# Patient Record
Sex: Male | Born: 2000 | Race: White | Hispanic: No | Marital: Single | Smoking: Never smoker
Health system: Southern US, Community
[De-identification: ages and names within clinical notes are randomized; demographics above are authoritative.]

## PROBLEM LIST (undated history)

## (undated) DIAGNOSIS — H902 Conductive hearing loss, unspecified: Secondary | ICD-10-CM

## (undated) DIAGNOSIS — H9325 Central auditory processing disorder: Secondary | ICD-10-CM

## (undated) HISTORY — DX: Central auditory processing disorder: H93.25

## (undated) HISTORY — DX: Conductive hearing loss, unspecified: H90.2

---
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
## 2020-01-04 ENCOUNTER — Ambulatory Visit: Payer: No Typology Code available for payment source | Attending: Internal Medicine

## 2020-01-04 DIAGNOSIS — Z20822 Contact with and (suspected) exposure to covid-19: Secondary | ICD-10-CM

## 2020-01-05 LAB — NOVEL CORONAVIRUS, NAA: SARS-CoV-2, NAA: NOT DETECTED

## 2020-03-03 ENCOUNTER — Ambulatory Visit: Payer: No Typology Code available for payment source | Attending: Internal Medicine

## 2020-03-03 DIAGNOSIS — Z23 Encounter for immunization: Secondary | ICD-10-CM

## 2020-03-03 NOTE — Progress Notes (Signed)
   Covid-19 Vaccination Clinic  Name:  Kevante Lunt    MRN: [id number] DOB: 07-Dec-2000  03/03/2020  Mr. Doyon was observed post Covid-19 immunization for 15 minutes without incident. He was provided with Vaccine Information Sheet and instruction to access the V-Safe system.   Mr. Embleton was instructed to call 911 with any severe reactions post vaccine: Marland Kitchen Difficulty breathing  . Swelling of face and throat  . A fast heartbeat  . A bad rash all over body  . Dizziness and weakness   Immunizations Administered    Name Date Dose VIS Date Route   Pfizer COVID-19 Vaccine 03/03/2020  3:29 PM 0.3 mL 11/11/2019 Intramuscular   Manufacturer: [employer], [employer]   Lot: [id number]   NDC: [address]-1

## 2020-03-27 ENCOUNTER — Ambulatory Visit: Payer: No Typology Code available for payment source | Attending: Internal Medicine

## 2020-03-27 DIAGNOSIS — Z23 Encounter for immunization: Secondary | ICD-10-CM

## 2020-03-27 NOTE — Progress Notes (Signed)
   Covid-19 Vaccination Clinic  Name:  Juan Hess    MRN: [id number] DOB: 2001/09/14  03/27/2020  Mr. Pavao was observed post Covid-19 immunization for 15 minutes without incident. He was provided with Vaccine Information Sheet and instruction to access the V-Safe system.   Mr. Toran was instructed to call 911 with any severe reactions post vaccine: Marland Kitchen Difficulty breathing  . Swelling of face and throat  . A fast heartbeat  . A bad rash all over body  . Dizziness and weakness   Immunizations Administered    Name Date Dose VIS Date Route   Pfizer COVID-19 Vaccine 03/27/2020 10:49 AM 0.3 mL 01/25/2019 Intramuscular   Manufacturer: [employer], [employer]   Lot: [id number]   NDC: [address]-2

## 2022-12-22 DIAGNOSIS — E039 Hypothyroidism, unspecified: Secondary | ICD-10-CM | POA: Diagnosis not present

## 2022-12-22 DIAGNOSIS — Z0000 Encounter for general adult medical examination without abnormal findings: Secondary | ICD-10-CM | POA: Diagnosis not present

## 2022-12-22 DIAGNOSIS — Z23 Encounter for immunization: Secondary | ICD-10-CM | POA: Diagnosis not present

## 2022-12-22 DIAGNOSIS — F900 Attention-deficit hyperactivity disorder, predominantly inattentive type: Secondary | ICD-10-CM | POA: Diagnosis not present

## 2023-01-30 DIAGNOSIS — E039 Hypothyroidism, unspecified: Secondary | ICD-10-CM | POA: Diagnosis not present

## 2023-01-30 DIAGNOSIS — F900 Attention-deficit hyperactivity disorder, predominantly inattentive type: Secondary | ICD-10-CM | POA: Diagnosis not present

## 2023-03-04 DIAGNOSIS — F900 Attention-deficit hyperactivity disorder, predominantly inattentive type: Secondary | ICD-10-CM | POA: Diagnosis not present

## 2023-06-08 DIAGNOSIS — E039 Hypothyroidism, unspecified: Secondary | ICD-10-CM | POA: Diagnosis not present

## 2023-06-08 DIAGNOSIS — F900 Attention-deficit hyperactivity disorder, predominantly inattentive type: Secondary | ICD-10-CM | POA: Diagnosis not present

## 2023-08-20 DIAGNOSIS — H1789 Other corneal scars and opacities: Secondary | ICD-10-CM | POA: Diagnosis not present

## 2023-12-24 DIAGNOSIS — Z1159 Encounter for screening for other viral diseases: Secondary | ICD-10-CM | POA: Diagnosis not present

## 2024-06-17 DIAGNOSIS — R109 Unspecified abdominal pain: Secondary | ICD-10-CM | POA: Diagnosis not present

## 2024-06-17 DIAGNOSIS — R58 Hemorrhage, not elsewhere classified: Secondary | ICD-10-CM | POA: Diagnosis not present
